# Patient Record
Sex: Female | Born: 1965 | Race: Black or African American | Hispanic: No | Marital: Married | Smoking: Never smoker
Health system: Southern US, Community
[De-identification: ages and names within clinical notes are randomized; demographics above are authoritative.]

## PROBLEM LIST (undated history)

## (undated) DIAGNOSIS — G43909 Migraine, unspecified, not intractable, without status migrainosus: Secondary | ICD-10-CM

## (undated) HISTORY — PX: APPENDECTOMY: SHX54

## (undated) HISTORY — PX: ABDOMINAL HYSTERECTOMY: SHX81

## (undated) HISTORY — PX: TONSILLECTOMY: SUR1361

---

## 1998-07-05 ENCOUNTER — Ambulatory Visit (HOSPITAL_COMMUNITY): Admission: RE | Admit: 1998-07-05 | Discharge: 1998-07-05 | Payer: Self-pay | Admitting: *Deleted

## 1998-07-16 ENCOUNTER — Ambulatory Visit (HOSPITAL_COMMUNITY): Admission: RE | Admit: 1998-07-16 | Discharge: 1998-07-16 | Payer: Self-pay | Admitting: *Deleted

## 1998-07-23 ENCOUNTER — Ambulatory Visit (HOSPITAL_COMMUNITY): Admission: RE | Admit: 1998-07-23 | Discharge: 1998-07-23 | Payer: Self-pay | Admitting: *Deleted

## 1998-10-07 ENCOUNTER — Emergency Department (HOSPITAL_COMMUNITY): Admission: EM | Admit: 1998-10-07 | Discharge: 1998-10-07 | Payer: Self-pay | Admitting: Emergency Medicine

## 1998-12-30 ENCOUNTER — Encounter: Admission: RE | Admit: 1998-12-30 | Discharge: 1999-03-30 | Payer: Self-pay | Admitting: Neurology

## 1999-09-04 ENCOUNTER — Emergency Department (HOSPITAL_COMMUNITY): Admission: EM | Admit: 1999-09-04 | Discharge: 1999-09-04 | Payer: Self-pay | Admitting: Emergency Medicine

## 2000-01-26 ENCOUNTER — Emergency Department (HOSPITAL_COMMUNITY): Admission: EM | Admit: 2000-01-26 | Discharge: 2000-01-26 | Payer: Self-pay | Admitting: Emergency Medicine

## 2000-01-27 ENCOUNTER — Emergency Department (HOSPITAL_COMMUNITY): Admission: EM | Admit: 2000-01-27 | Discharge: 2000-01-27 | Payer: Self-pay | Admitting: *Deleted

## 2000-03-17 ENCOUNTER — Ambulatory Visit (HOSPITAL_COMMUNITY): Admission: RE | Admit: 2000-03-17 | Discharge: 2000-03-17 | Payer: Self-pay

## 2000-09-13 ENCOUNTER — Other Ambulatory Visit: Admission: RE | Admit: 2000-09-13 | Discharge: 2000-09-13 | Payer: Self-pay | Admitting: Obstetrics

## 2000-09-27 ENCOUNTER — Emergency Department (HOSPITAL_COMMUNITY): Admission: EM | Admit: 2000-09-27 | Discharge: 2000-09-27 | Payer: Self-pay | Admitting: Emergency Medicine

## 2000-09-27 ENCOUNTER — Encounter: Payer: Self-pay | Admitting: Emergency Medicine

## 2000-11-03 ENCOUNTER — Encounter: Admission: RE | Admit: 2000-11-03 | Discharge: 2001-02-01 | Payer: Self-pay | Admitting: Internal Medicine

## 2001-04-02 ENCOUNTER — Encounter: Payer: Self-pay | Admitting: Emergency Medicine

## 2001-04-02 ENCOUNTER — Emergency Department (HOSPITAL_COMMUNITY): Admission: EM | Admit: 2001-04-02 | Discharge: 2001-04-02 | Payer: Self-pay | Admitting: Emergency Medicine

## 2001-05-27 ENCOUNTER — Emergency Department (HOSPITAL_COMMUNITY): Admission: EM | Admit: 2001-05-27 | Discharge: 2001-05-27 | Payer: Self-pay | Admitting: Emergency Medicine

## 2001-06-01 ENCOUNTER — Emergency Department (HOSPITAL_COMMUNITY): Admission: EM | Admit: 2001-06-01 | Discharge: 2001-06-02 | Payer: Self-pay | Admitting: Emergency Medicine

## 2002-01-16 ENCOUNTER — Other Ambulatory Visit: Admission: RE | Admit: 2002-01-16 | Discharge: 2002-01-16 | Payer: Self-pay | Admitting: Internal Medicine

## 2002-01-18 ENCOUNTER — Encounter: Payer: Self-pay | Admitting: Internal Medicine

## 2002-01-18 ENCOUNTER — Encounter: Admission: RE | Admit: 2002-01-18 | Discharge: 2002-01-18 | Payer: Self-pay | Admitting: Internal Medicine

## 2002-12-12 ENCOUNTER — Encounter: Payer: Self-pay | Admitting: Emergency Medicine

## 2002-12-12 ENCOUNTER — Inpatient Hospital Stay (HOSPITAL_COMMUNITY): Admission: AD | Admit: 2002-12-12 | Discharge: 2002-12-14 | Payer: Self-pay | Admitting: Obstetrics

## 2003-09-10 ENCOUNTER — Emergency Department (HOSPITAL_COMMUNITY): Admission: EM | Admit: 2003-09-10 | Discharge: 2003-09-10 | Payer: Self-pay | Admitting: *Deleted

## 2003-10-01 ENCOUNTER — Emergency Department (HOSPITAL_COMMUNITY): Admission: EM | Admit: 2003-10-01 | Discharge: 2003-10-01 | Payer: Self-pay | Admitting: Emergency Medicine

## 2004-07-25 ENCOUNTER — Emergency Department (HOSPITAL_COMMUNITY): Admission: EM | Admit: 2004-07-25 | Discharge: 2004-07-25 | Payer: Self-pay | Admitting: Emergency Medicine

## 2004-07-27 ENCOUNTER — Emergency Department (HOSPITAL_COMMUNITY): Admission: EM | Admit: 2004-07-27 | Discharge: 2004-07-27 | Payer: Self-pay | Admitting: Emergency Medicine

## 2004-12-29 ENCOUNTER — Emergency Department (HOSPITAL_COMMUNITY): Admission: EM | Admit: 2004-12-29 | Discharge: 2004-12-29 | Payer: Self-pay | Admitting: Emergency Medicine

## 2005-05-21 ENCOUNTER — Emergency Department (HOSPITAL_COMMUNITY): Admission: EM | Admit: 2005-05-21 | Discharge: 2005-05-21 | Payer: Self-pay | Admitting: Family Medicine

## 2005-06-03 ENCOUNTER — Emergency Department (HOSPITAL_COMMUNITY): Admission: EM | Admit: 2005-06-03 | Discharge: 2005-06-03 | Payer: Self-pay | Admitting: Emergency Medicine

## 2005-06-14 ENCOUNTER — Emergency Department (HOSPITAL_COMMUNITY): Admission: EM | Admit: 2005-06-14 | Discharge: 2005-06-14 | Payer: Self-pay | Admitting: Emergency Medicine

## 2005-06-15 ENCOUNTER — Emergency Department (HOSPITAL_COMMUNITY): Admission: EM | Admit: 2005-06-15 | Discharge: 2005-06-15 | Payer: Self-pay | Admitting: Emergency Medicine

## 2006-05-04 IMAGING — CT CT HEAD W/O CM
2 of 3 series · 14 of 33 positions shown, 18 images · non-contrast
Comparison: None.

CLINICAL DATA: Dizziness and headaches for the past two weeks.  Previous history of migraine headaches.  
 CRANIAL CT - WITHOUT CONTRAST -   07/25/04
TECHNIQUE: 5 mm axial images were obtained from the skull base through the brain to the vertex.

[Series 2: — · sagittal · 0.67mm/px · 1 of 3 slices shown (1 of 2)]
[im 2/3  brain]
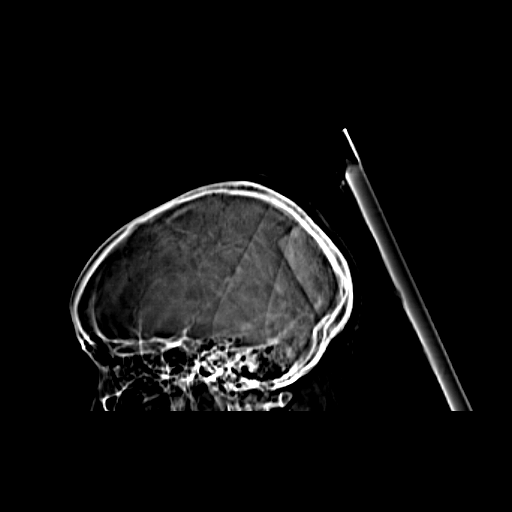

[Series 3: — · axial · 0.43mm/px · z∈[+1219,+1339]mm · 13 of 29 slices shown, 17 images (2 of 2)]
[im 3/29  brain]
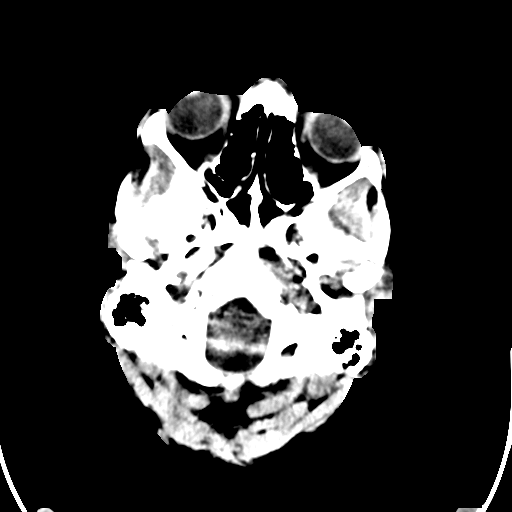
[im 3/29  bone]
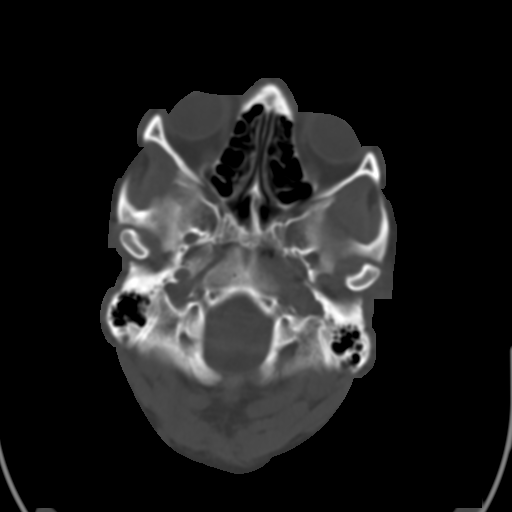
[im 5/29  brain]
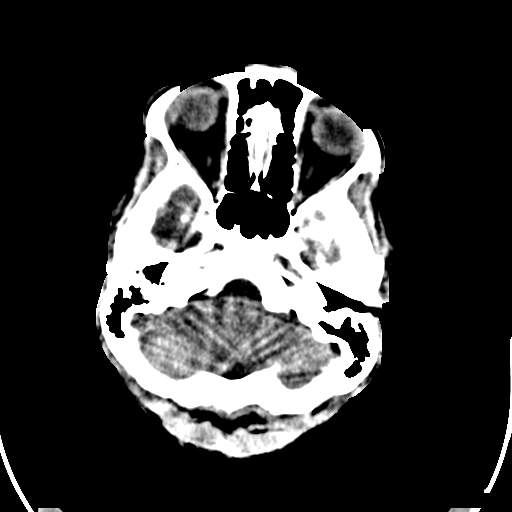
[im 7/29  brain]
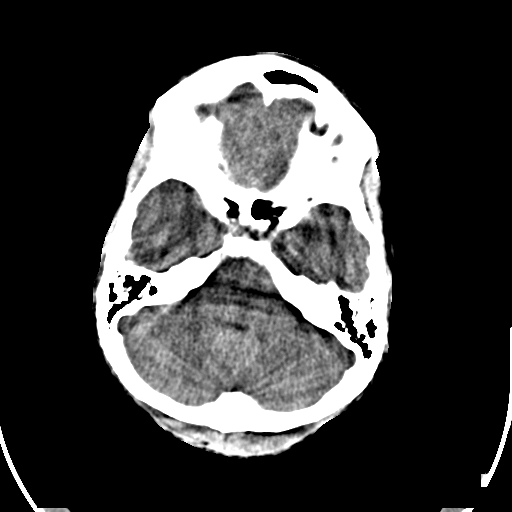
[im 9/29  brain]
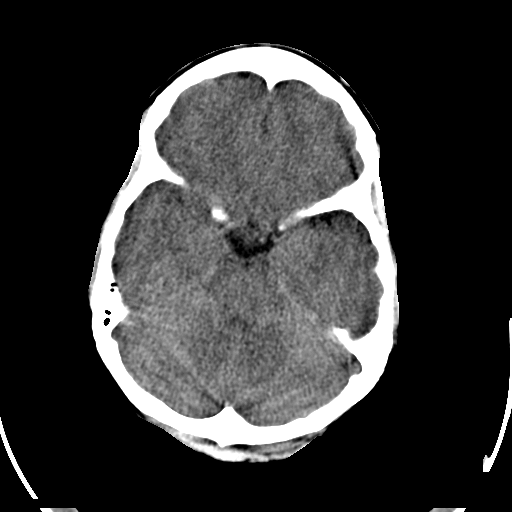
[im 11/29  brain]
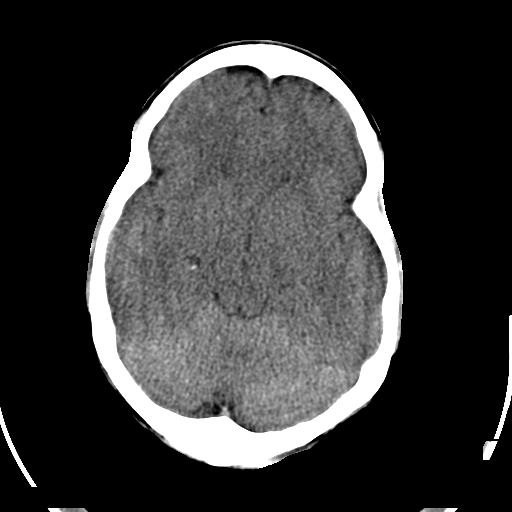
[im 11/29  bone]
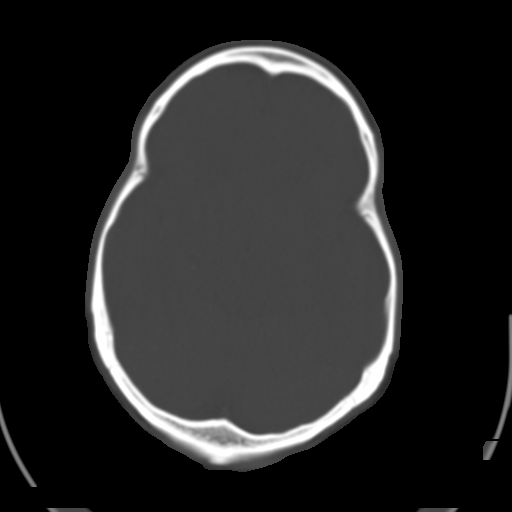
[im 13/29  brain]
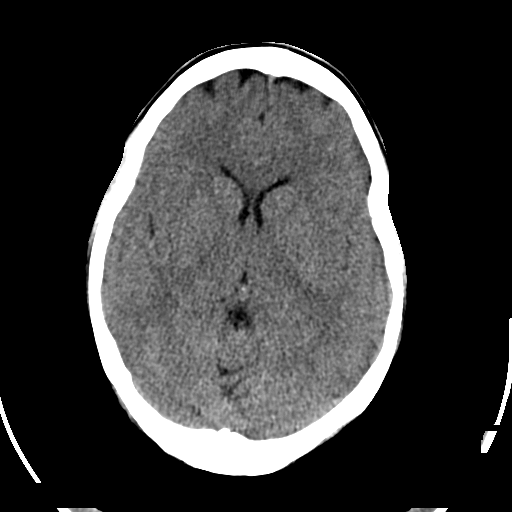
[im 15/29  brain]
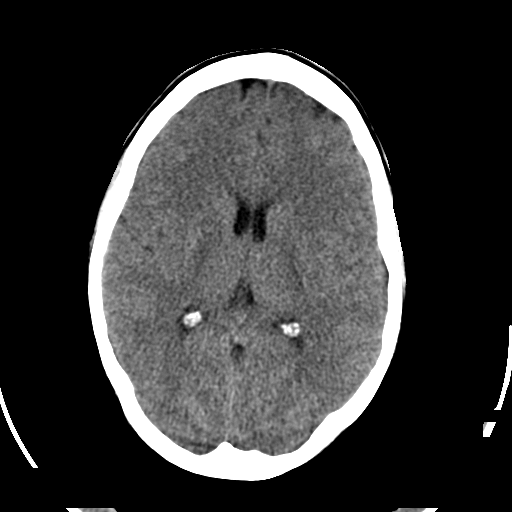
[im 17/29  brain]
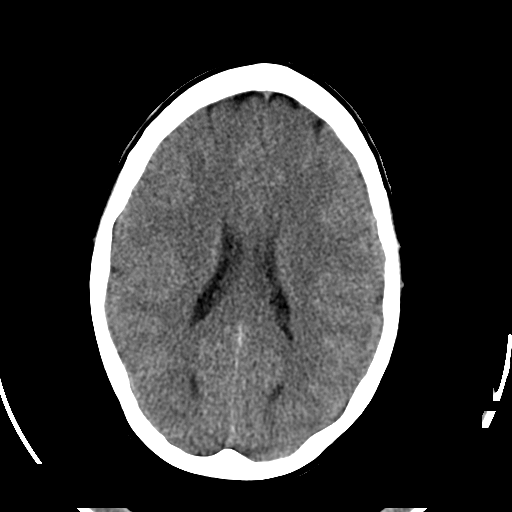
[im 19/29  brain]
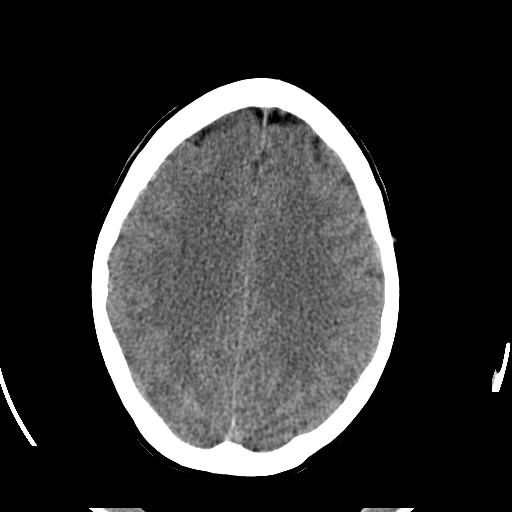
[im 19/29  bone]
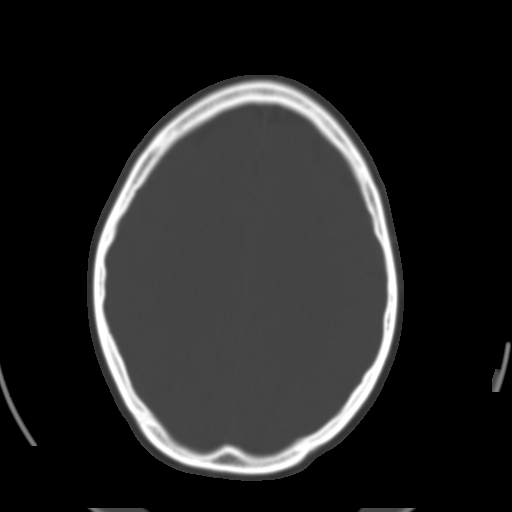
[im 21/29  brain]
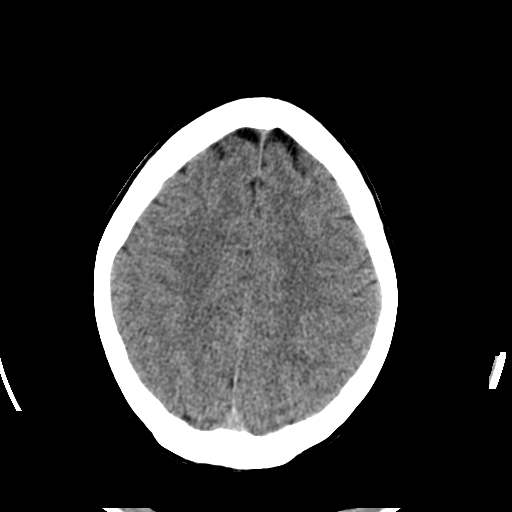
[im 23/29  brain]
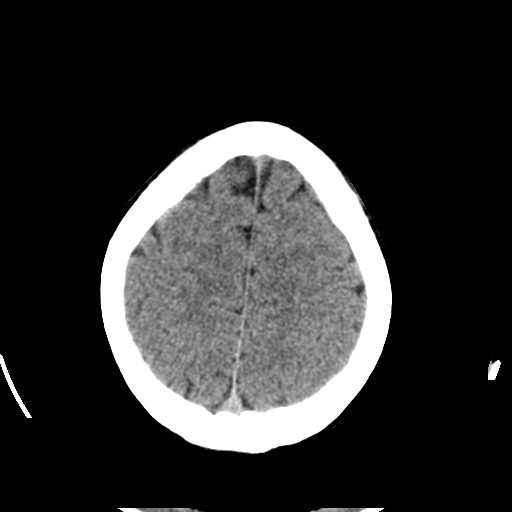
[im 25/29  brain]
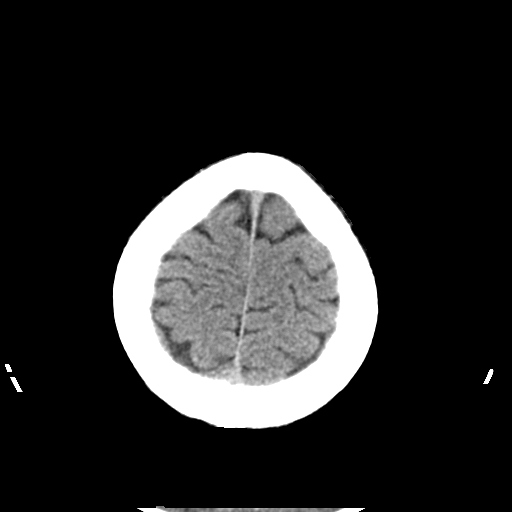
[im 27/29  brain]
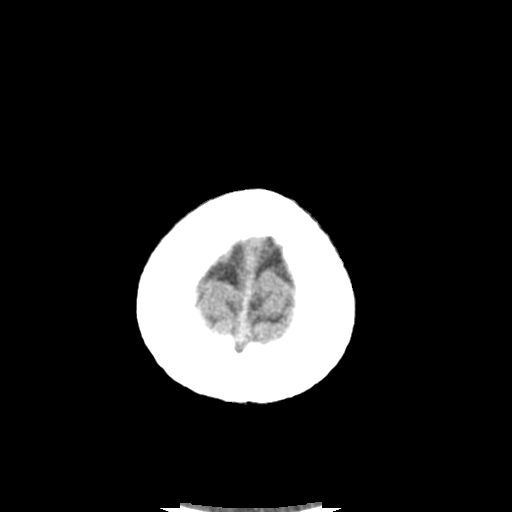
[im 27/29  bone]
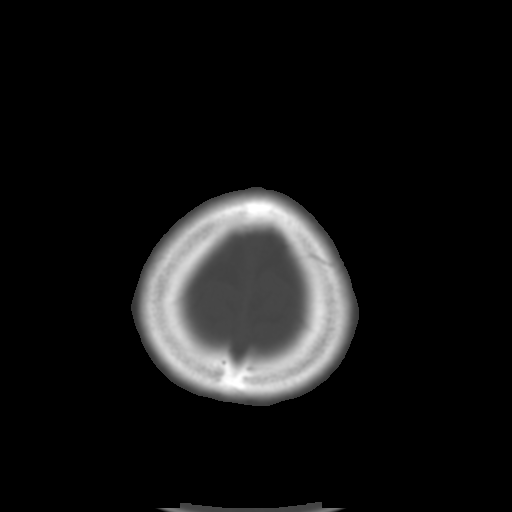

[14 of 33 positions shown; findings below may reference images not displayed]

I have the report of a previous unenhanced cranial CT of 09/27/00 interpreted as negative.
FINDINGS: The ventricular system is normal in size and appearance for age.  There is no mass effect or midline shift.  There is no hemorrhage or hematoma.  No extra-axial fluid collections are identified.  I see no focal brain parenchymal abnormalities.
 The bone window images demonstrate no osseous abnormalities involving the skull.  The visualized paranasal sinuses and the mastoid air cells appear well aerated.   
 IMPRESSION
 Normal unenhanced cranial CT.

## 2007-03-13 IMAGING — CR DG TOE 2ND 2+V*L*
3 series · 3 of 3 positions shown · non-contrast
Comparison: none

CLINICAL DATA: Stubbed toe.
 LEFT SECOND TOE ? 3 VIEW:
 There is no evidence of fracture or dislocation.  There is no evidence of arthropathy or other focal bone abnormality.  Soft tissues are unremarkable.

[t toes ap left]
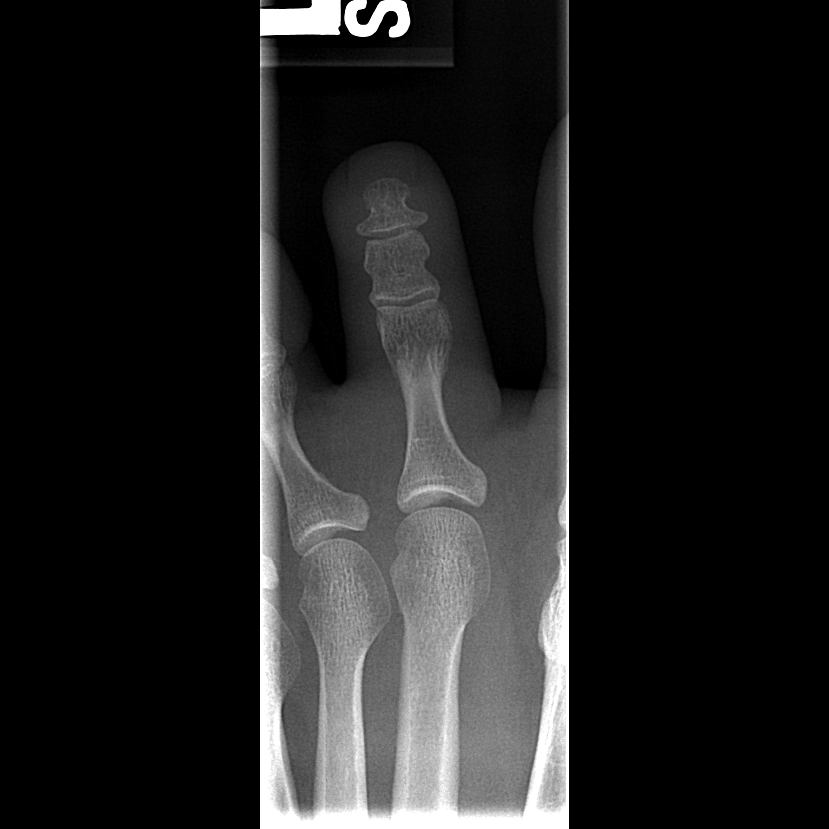

[t toes oblique left]
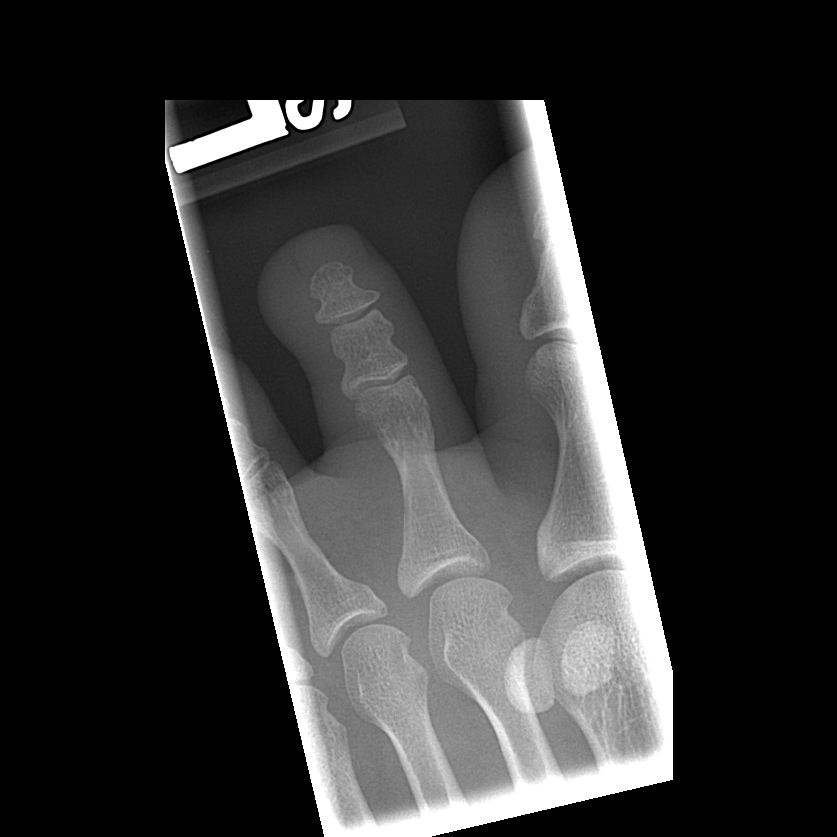

[t toes lateral left]
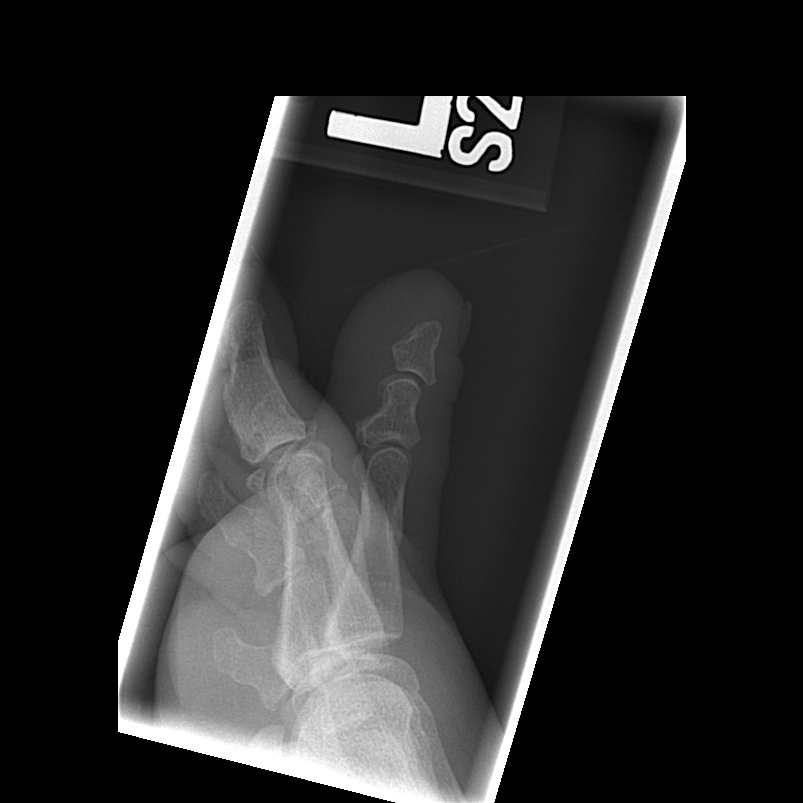

[3 of 3 positions shown; findings below may reference images not displayed]

IMPRESSION: Negative.

## 2016-01-26 ENCOUNTER — Emergency Department (HOSPITAL_COMMUNITY)
Admission: EM | Admit: 2016-01-26 | Discharge: 2016-01-26 | Disposition: A | Discharge status: Home / Self Care | Payer: Self-pay | Attending: Emergency Medicine | Admitting: Emergency Medicine

## 2016-01-26 ENCOUNTER — Encounter (HOSPITAL_COMMUNITY): Payer: Self-pay

## 2016-01-26 DIAGNOSIS — G43809 Other migraine, not intractable, without status migrainosus: Secondary | ICD-10-CM | POA: Insufficient documentation

## 2016-01-26 HISTORY — DX: Migraine, unspecified, not intractable, without status migrainosus: G43.909

## 2016-01-26 MED ORDER — PROCHLORPERAZINE EDISYLATE 5 MG/ML IJ SOLN
10.0000 mg | Freq: Once | INTRAMUSCULAR | Status: AC
  Administered 2016-01-26: 10 mg via INTRAVENOUS
  Filled 2016-01-26: qty 2

## 2016-01-26 MED ORDER — KETOROLAC TROMETHAMINE 30 MG/ML IJ SOLN
30.0000 mg | Freq: Once | INTRAMUSCULAR | Status: AC
  Administered 2016-01-26: 30 mg via INTRAVENOUS
  Filled 2016-01-26: qty 1

## 2016-01-26 MED ORDER — DIPHENHYDRAMINE HCL 50 MG/ML IJ SOLN
50.0000 mg | Freq: Once | INTRAMUSCULAR | Status: AC
  Administered 2016-01-26: 50 mg via INTRAVENOUS
  Filled 2016-01-26: qty 1

## 2016-01-26 NOTE — Discharge Instructions (Signed)
Follow-up with your doctor for further evaluation of your migraines. Return to ED for any new or worsening symptoms as we discussed.  Migraine Headache A migraine headache is an intense, throbbing pain on one or both sides of your head. A migraine can last for 30 minutes to several hours. CAUSES  The exact cause of a migraine headache is not always known. However, a migraine may be caused when nerves in the brain become irritated and release chemicals that cause inflammation. This causes pain. Certain things may also trigger migraines, such as:  Alcohol.  Smoking.  Stress.  Menstruation.  Aged cheeses.  Foods or drinks that contain nitrates, glutamate, aspartame, or tyramine.  Lack of sleep.  Chocolate.  Caffeine.  Hunger.  Physical exertion.  Fatigue.  Medicines used to treat chest pain (nitroglycerine), birth control pills, estrogen, and some blood pressure medicines. SIGNS AND SYMPTOMS  Pain on one or both sides of your head.  Pulsating or throbbing pain.  Severe pain that prevents daily activities.  Pain that is aggravated by any physical activity.  Nausea, vomiting, or both.  Dizziness.  Pain with exposure to bright lights, loud noises, or activity.  General sensitivity to bright lights, loud noises, or smells. Before you get a migraine, you may get warning signs that a migraine is coming (aura). An aura may include:  Seeing flashing lights.  Seeing bright spots, halos, or zigzag lines.  Having tunnel vision or blurred vision.  Having feelings of numbness or tingling.  Having trouble talking.  Having muscle weakness. DIAGNOSIS  A migraine headache is often diagnosed based on:  Symptoms.  Physical exam.  A CT scan or MRI of your head. These imaging tests cannot diagnose migraines, but they can help rule out other causes of headaches. TREATMENT Medicines may be given for pain and nausea. Medicines can also be given to help prevent recurrent  migraines.  HOME CARE INSTRUCTIONS  Only take over-the-counter or prescription medicines for pain or discomfort as directed by your health care provider. The use of long-term narcotics is not recommended.  Lie down in a dark, quiet room when you have a migraine.  Keep a journal to find out what may trigger your migraine headaches. For example, write down:  What you eat and drink.  How much sleep you get.  Any change to your diet or medicines.  Limit alcohol consumption.  Quit smoking if you smoke.  Get 7-9 hours of sleep, or as recommended by your health care provider.  Limit stress.  Keep lights dim if bright lights bother you and make your migraines worse. SEEK IMMEDIATE MEDICAL CARE IF:   Your migraine becomes severe.  You have a fever.  You have a stiff neck.  You have vision loss.  You have muscular weakness or loss of muscle control.  You start losing your balance or have trouble walking.  You feel faint or pass out.  You have severe symptoms that are different from your first symptoms. MAKE SURE YOU:   Understand these instructions.  Will watch your condition.  Will get help right away if you are not doing well or get worse.   This information is not intended to replace advice given to you by your health care provider. Make sure you discuss any questions you have with your health care provider.   Document Released: 10/19/2005 Document Revised: 11/09/2014 Document Reviewed: 06/26/2013 Elsevier Interactive Patient Education [employer]2016 [employer].

## 2016-01-26 NOTE — ED Notes (Signed)
Onset yesterday migraine headache, nausea, light sensitivity.  No vomiting.  Migraine meds not effective.

## 2016-01-26 NOTE — ED Notes (Signed)
PT c.o. Migraine with history of same, pain 10/10 to front of head along with nausea.

## 2016-01-26 NOTE — ED Provider Notes (Signed)
CSN: [id number]     Arrival date & time 01/26/16  1722 History   First MD Initiated Contact with Patient 01/26/16 1755     Chief Complaint  Patient presents with  . Migraine     (Consider location/radiation/quality/duration/timing/severity/associated sxs/prior Treatment) HPI Christine Joseph is a 50 y.o. female with a history of migraines presents for evaluation of migraine. Patient reports she is from out of town, experienced a migraine that started on Saturday morning, typical of her normal migraine pattern. Head pain, throbbing in nature throughout frontal head. Not relieved with Imitrex and Reglan at home. She reports frontal headache. Denies any vision changes, neck stiffness, fevers, rash, numbness or weakness, nausea or vomiting.  Past Medical History  Diagnosis Date  . Migraine    Past Surgical History  Procedure Laterality Date  . Appendectomy    . Abdominal hysterectomy    . Tonsillectomy     No family history on file. Social History  Substance Use Topics  . Smoking status: Never Smoker   . Smokeless tobacco: Not on file  . Alcohol Use: No   OB History    No data available     Review of Systems A 10 point review of systems was completed and was negative except for pertinent positives and negatives as mentioned in the history of present illness     Allergies  Codeine  Home Medications   Prior to Admission medications   Not on File   BP 134/93 mmHg  Pulse 96  Temp(Src) 98.5 F (36.9 C) (Oral)  Resp 16  Ht 5' 2.5" (1.588 m)  Wt 68.402 kg  BMI 27.12 kg/m2  SpO2 97% Physical Exam  Constitutional: She is oriented to person, place, and time. She appears well-developed and well-nourished. No distress.  HENT:  Head: Normocephalic and atraumatic.  Mouth/Throat: Oropharynx is clear and moist.  Eyes: Conjunctivae and EOM are normal. Pupils are equal, round, and reactive to light.  Neck: Normal range of motion.  No meningismus or nuchal rigidity   Cardiovascular: Normal rate, regular rhythm and normal heart sounds.   Pulmonary/Chest: Effort normal and breath sounds normal.  Abdominal: Soft. There is no tenderness.  Musculoskeletal: Normal range of motion. She exhibits no edema or tenderness.  Neurological: She is alert and oriented to person, place, and time.  Cranial nerves III through XII grossly intact. Motor strength is 5/5 in all 4 extremities and sensation is intact to light touch. Completes finger to nose coordination movements without difficulty. No nystagmus. Gait is baseline without ataxia.  Skin: She is not diaphoretic.    ED Course  Procedures (including critical care time) Labs Review Labs Reviewed - No data to display  Imaging Review No results found. I have personally reviewed and evaluated these images and lab results as part of my medical decision-making.   EKG Interpretation None     Meds given in ED:  Medications  ketorolac (TORADOL) 30 MG/ML injection 30 mg (30 mg Intravenous Given 01/26/16 1814)  prochlorperazine (COMPAZINE) injection 10 mg (10 mg Intravenous Given 01/26/16 1814)  diphenhydrAMINE (BENADRYL) injection 50 mg (50 mg Intravenous Given 01/26/16 1814)    New Prescriptions   No medications on file   Filed Vitals:   01/26/16 1726  BP: 134/93  Pulse: 96  Temp: 98.5 F (36.9 C)  TempSrc: Oral  Resp: 16  Height: 5' 2.5" (1.588 m)  Weight: 68.402 kg  SpO2: 97%    MDM  Patient has history of migraines and this is  exacerbation of typical migraine. Pt migraine treated in the ED with resolution of symptoms.  Presentation is like pts typical HA and non concerning for [facility], ICH, Meningitis, or temporal arteritis. Pt is afebrile with no focal neuro deficits, nuchal rigidity, or change in vision. Pt is to follow up with PCP to discuss prophylactic medication. Pt verbalizes understanding and is agreeable with plan to dc.  The patient appears reasonably screened and/or stabilized for discharge and  I doubt any other medical condition or other [facility] requiring further screening, evaluation, or treatment in the ED at this time prior to discharge.   Final diagnoses:  Other type of nonintractable migraine        Christine Peek, PA-C 01/26/16 2031  Christine Munch, MD 01/26/16 2146

## 2016-02-12 ENCOUNTER — Emergency Department (HOSPITAL_COMMUNITY)
Admission: EM | Admit: 2016-02-12 | Discharge: 2016-02-12 | Disposition: A | Discharge status: Home / Self Care | Payer: Self-pay | Attending: Emergency Medicine | Admitting: Emergency Medicine

## 2016-02-12 ENCOUNTER — Encounter (HOSPITAL_COMMUNITY): Payer: Self-pay

## 2016-02-12 ENCOUNTER — Encounter (HOSPITAL_COMMUNITY): Payer: Self-pay | Admitting: *Deleted

## 2016-02-12 ENCOUNTER — Inpatient Hospital Stay (HOSPITAL_COMMUNITY)
Admission: AD | Admit: 2016-02-12 | Discharge: 2016-02-13 | Disposition: A | Discharge status: Home / Self Care | Payer: Self-pay | Source: Ambulatory Visit | Attending: Obstetrics and Gynecology | Admitting: Obstetrics and Gynecology

## 2016-02-12 DIAGNOSIS — Z90710 Acquired absence of both cervix and uterus: Secondary | ICD-10-CM | POA: Insufficient documentation

## 2016-02-12 DIAGNOSIS — G43909 Migraine, unspecified, not intractable, without status migrainosus: Secondary | ICD-10-CM | POA: Insufficient documentation

## 2016-02-12 DIAGNOSIS — G43009 Migraine without aura, not intractable, without status migrainosus: Secondary | ICD-10-CM | POA: Insufficient documentation

## 2016-02-12 MED ORDER — SODIUM CHLORIDE 0.9 % IV SOLN
INTRAVENOUS | Status: DC
  Administered 2016-02-12: 23:00:00 via INTRAVENOUS

## 2016-02-12 MED ORDER — CYCLOBENZAPRINE HCL 10 MG PO TABS
10.0000 mg | ORAL_TABLET | Freq: Once | ORAL | Status: AC
  Administered 2016-02-13: 10 mg via ORAL
  Filled 2016-02-12: qty 1

## 2016-02-12 MED ORDER — KETOROLAC TROMETHAMINE 30 MG/ML IJ SOLN
30.0000 mg | Freq: Once | INTRAMUSCULAR | Status: AC
  Administered 2016-02-12: 30 mg via INTRAVENOUS
  Filled 2016-02-12: qty 1

## 2016-02-12 MED ORDER — DEXAMETHASONE SODIUM PHOSPHATE 10 MG/ML IJ SOLN
10.0000 mg | Freq: Once | INTRAMUSCULAR | Status: AC
  Administered 2016-02-12: 10 mg via INTRAVENOUS
  Filled 2016-02-12: qty 1

## 2016-02-12 MED ORDER — METOCLOPRAMIDE HCL 5 MG/ML IJ SOLN
10.0000 mg | Freq: Once | INTRAMUSCULAR | Status: AC
  Administered 2016-02-12: 10 mg via INTRAVENOUS
  Filled 2016-02-12: qty 2

## 2016-02-12 MED ORDER — DIPHENHYDRAMINE HCL 50 MG/ML IJ SOLN
25.0000 mg | Freq: Once | INTRAMUSCULAR | Status: AC
  Administered 2016-02-12: 25 mg via INTRAVENOUS
  Filled 2016-02-12: qty 1

## 2016-02-12 NOTE — MAU Provider Note (Signed)
History     CSN: [id number]  Arrival date and time: 02/12/16 2206   First Provider Initiated Contact with Patient 02/12/16 2239     CC:  Migraine headache    This is a 50 y.o. female who presents with complaint of migraine headache.  States this is similar to many migraines she has had in the past.  States she usually requires ED treatment of Migraines.  Does not have a primary doctor.  States she went to [facility] but the wait was too long, and "I saw [facility], so I am a woman so I came here".   Patient reports she is from out of town, experienced a migraine that started today, typical of her normal migraine pattern. Head pain, throbbing in nature throughout frontal head. She reports frontal headache. Denies any vision changes, neck stiffness, fevers, rash, numbness or weakness,  or vomiting.  Does report nausea.      States usually gets Tramadol, Dilaudid, Reglan, Flexeril and Compazine.  States has been on may prophylactic regimens in the past including beta blockers and scalp injections.  Is not on any medication now due to lack of primary doctor.    Headache  This is a new problem. The current episode started today. The problem occurs constantly. The problem has been unchanged. The pain is located in the bilateral and frontal region. The pain quality is similar to prior headaches. The quality of the pain is described as aching, dull, pulsating and throbbing. The pain is severe. Associated symptoms include nausea and photophobia. Pertinent negatives include no abdominal pain, back pain, blurred vision, dizziness, fever, muscle aches, sinus pressure, visual change, vomiting or weakness. The symptoms are aggravated by bright light. She has tried nothing for the symptoms. Her past medical history is significant for migraine headaches.    OB History    Gravida Para Term Preterm AB TAB SAB Ectopic Multiple Living   Past Medical History  Diagnosis Date  . Migraine      Past Surgical History  Procedure Laterality Date  . Appendectomy    . Abdominal hysterectomy    . Tonsillectomy      History reviewed. No pertinent family history.  Social History  Substance Use Topics  . Smoking status: Never Smoker   . Smokeless tobacco: None  . Alcohol Use: No    Allergies:  Allergies  Allergen Reactions  . Codeine Hives    No prescriptions prior to admission   Medical, Surgical, Family and Social histories reviewed and are listed above.  Medications and allergies reviewed.   Review of Systems  Constitutional: Negative for fever.  HENT: Negative for sinus pressure.   Eyes: Positive for photophobia. Negative for blurred vision.  Gastrointestinal: Positive for nausea. Negative for vomiting, abdominal pain and diarrhea.  Musculoskeletal: Negative for back pain.  Neurological: Positive for headaches. Negative for dizziness and weakness.   Other systems negative  Physical Exam   Blood pressure 158/90, pulse 90, temperature 98.2 F (36.8 C), temperature source Oral, resp. rate 18, height 5' 2.5" (1.588 m), weight 67.132 kg (148 lb), SpO2 98 %.  Physical Exam  Constitutional: She is oriented to person, place, and time. She appears well-developed and well-nourished. No distress (but uncomfortable looking).  HENT:  Head: Normocephalic and atraumatic.  Neck: Normal range of motion.  Cardiovascular: Normal rate, regular rhythm and normal heart sounds.  Exam reveals no gallop and no friction  rub.   No murmur heard. Respiratory: Effort normal and breath sounds normal. No respiratory distress. She has no wheezes. She has no rales. She exhibits no tenderness.  GI: Soft. She exhibits no distension. There is no tenderness. There is no rebound and no guarding.  Genitourinary:  Exam not indicated Had hysterectomy  Musculoskeletal: Normal range of motion.  Neurological: She is alert and oriented to person, place, and time.  Skin: Skin is warm and dry.   Psychiatric: She has a normal mood and affect.    MAU Course  Procedures  MDM Reviewed treatment from ED in March for similar migraine (they gave Compazine, Benadryl, and Toradol) Orders placed for NS IV, Toradol, Reglan, Benadryl, and Decadron (to prevent rebound).  Patient has history of migraines and this is like a typical migraine. migraine treated in the ED with resolution of symptoms. Presentation is like pts typical HA. Pt is afebrile with no focal neuro deficits, nuchal rigidity, or change in vision. Discussed [facility] which precludes a detailed neurologic exam.  Patient states she understands and just wants her usual meds.  I told her I am not comfortable giving her Flexeril, Tramadol, and Dilaudid.  Discussed narcotics can cause rebound headaches, as well as significant respiratory depression.   It has been 30 minutes since she was given the meds above. States she still rates her headache a "7" Will add Flexeril.  Report given to oncoming NP. Assessment and Plan    [facility] Center[facility]ILLIAMS,MARIE 02/12/2016, 10:39 PM    Care assumed @ 0000 Pt reports improvement in symptoms & ready to go home. Now rates headache 4/10.   A: 1. Migraine without aura and without status migrainosus, not intractable     P: Discharge home Discussed going to urgent care or ED if symptoms worsen or return F/u with pcp for routine care or migraine management

## 2016-02-12 NOTE — MAU Note (Signed)
Pt presents with complaint of migraine since this am, vomiting and light sensitive. Normally takes OTC meds because she doesn't currently have a doctor.

## 2016-02-12 NOTE — ED Notes (Signed)
Pt c/o migraine starting today. Hx of same. States she took two muscle relaxers with no relief.

## 2016-02-13 DIAGNOSIS — G43009 Migraine without aura, not intractable, without status migrainosus: Secondary | ICD-10-CM

## 2016-02-13 NOTE — Discharge Instructions (Signed)
